# Patient Record
Sex: Male | Born: 1955 | Race: White | Hispanic: No | Smoking: Current every day smoker
Health system: Southern US, Community
[De-identification: ages and names within clinical notes are randomized; demographics above are authoritative.]

## PROBLEM LIST (undated history)

## (undated) DIAGNOSIS — Z87442 Personal history of urinary calculi: Secondary | ICD-10-CM

## (undated) DIAGNOSIS — I10 Essential (primary) hypertension: Secondary | ICD-10-CM

## (undated) DIAGNOSIS — E7800 Pure hypercholesterolemia, unspecified: Secondary | ICD-10-CM

## (undated) DIAGNOSIS — I240 Acute coronary thrombosis not resulting in myocardial infarction: Secondary | ICD-10-CM

## (undated) DIAGNOSIS — E119 Type 2 diabetes mellitus without complications: Secondary | ICD-10-CM

## (undated) HISTORY — PX: CYSTOSTOMY W/ STENT INSERTION: SHX1434

## (undated) HISTORY — PX: LITHOTRIPSY: SUR834

## (undated) HISTORY — PX: OTHER SURGICAL HISTORY: SHX169

---

## 2018-05-02 ENCOUNTER — Other Ambulatory Visit: Payer: Self-pay | Admitting: Urology

## 2018-05-05 NOTE — Progress Notes (Signed)
Spoke to patient on telephone about extracorporeal shock wave lithotripsy scheduled for August 1st. Patient to arrive to short stay at 6 AM. Reviewed instructions on Pre ESWL instruction sheet. Obtained EKG from Dr Drue DunJames Milam's office in [place], [place].

## 2018-05-08 ENCOUNTER — Ambulatory Visit (HOSPITAL_COMMUNITY)
Admission: RE | Admit: 2018-05-08 | Discharge: 2018-05-08 | Disposition: A | Discharge status: Home / Self Care | Payer: BLUE CROSS/BLUE SHIELD | Source: Ambulatory Visit | Attending: Urology | Admitting: Urology

## 2018-05-08 ENCOUNTER — Encounter (HOSPITAL_COMMUNITY): Payer: Self-pay | Admitting: General Practice

## 2018-05-08 ENCOUNTER — Encounter (HOSPITAL_COMMUNITY)
Admission: RE | Disposition: A | Discharge status: Home / Self Care | Payer: Self-pay | Source: Ambulatory Visit | Attending: Urology

## 2018-05-08 ENCOUNTER — Ambulatory Visit (HOSPITAL_COMMUNITY): Payer: BLUE CROSS/BLUE SHIELD

## 2018-05-08 DIAGNOSIS — Z7982 Long term (current) use of aspirin: Secondary | ICD-10-CM | POA: Diagnosis not present

## 2018-05-08 DIAGNOSIS — Z955 Presence of coronary angioplasty implant and graft: Secondary | ICD-10-CM | POA: Insufficient documentation

## 2018-05-08 DIAGNOSIS — E119 Type 2 diabetes mellitus without complications: Secondary | ICD-10-CM | POA: Diagnosis not present

## 2018-05-08 DIAGNOSIS — I10 Essential (primary) hypertension: Secondary | ICD-10-CM | POA: Diagnosis not present

## 2018-05-08 DIAGNOSIS — N200 Calculus of kidney: Secondary | ICD-10-CM | POA: Insufficient documentation

## 2018-05-08 DIAGNOSIS — Z7901 Long term (current) use of anticoagulants: Secondary | ICD-10-CM | POA: Insufficient documentation

## 2018-05-08 DIAGNOSIS — Z794 Long term (current) use of insulin: Secondary | ICD-10-CM | POA: Insufficient documentation

## 2018-05-08 DIAGNOSIS — Z79899 Other long term (current) drug therapy: Secondary | ICD-10-CM | POA: Diagnosis not present

## 2018-05-08 HISTORY — PX: EXTRACORPOREAL SHOCK WAVE LITHOTRIPSY: SHX1557

## 2018-05-08 HISTORY — DX: Acute coronary thrombosis not resulting in myocardial infarction: I24.0

## 2018-05-08 HISTORY — DX: Personal history of urinary calculi: Z87.442

## 2018-05-08 HISTORY — DX: Type 2 diabetes mellitus without complications: E11.9

## 2018-05-08 HISTORY — DX: Pure hypercholesterolemia, unspecified: E78.00

## 2018-05-08 HISTORY — DX: Essential (primary) hypertension: I10

## 2018-05-08 LAB — GLUCOSE, CAPILLARY: Glucose-Capillary: 252 mg/dL — ABNORMAL HIGH (ref 70–99)

## 2018-05-08 SURGERY — LITHOTRIPSY, ESWL
Anesthesia: LOCAL | Laterality: Left

## 2018-05-08 MED ORDER — HYDROCODONE-ACETAMINOPHEN 10-325 MG PO TABS: 1.0000 | ORAL_TABLET | ORAL | 0 refills | Status: AC | PRN

## 2018-05-08 MED ORDER — CIPROFLOXACIN HCL 500 MG PO TABS
500.0000 mg | ORAL_TABLET | ORAL | Status: AC
  Administered 2018-05-08: 500 mg via ORAL
  Filled 2018-05-08: qty 1

## 2018-05-08 MED ORDER — SODIUM CHLORIDE 0.9 % IV SOLN
INTRAVENOUS | Status: DC
  Administered 2018-05-08: 07:00:00 via INTRAVENOUS

## 2018-05-08 MED ORDER — DIAZEPAM 5 MG PO TABS
10.0000 mg | ORAL_TABLET | ORAL | Status: AC
  Administered 2018-05-08: 10 mg via ORAL
  Filled 2018-05-08: qty 2

## 2018-05-08 MED ORDER — DIPHENHYDRAMINE HCL 25 MG PO CAPS
25.0000 mg | ORAL_CAPSULE | ORAL | Status: AC
  Administered 2018-05-08: 25 mg via ORAL
  Filled 2018-05-08: qty 1

## 2018-05-08 MED ORDER — TAMSULOSIN HCL 0.4 MG PO CAPS: 0.4000 mg | ORAL_CAPSULE | ORAL | 0 refills | Status: AC

## 2018-05-08 NOTE — H&P (Signed)
HPI: Calvin Bolton is a 62 year-old male with a left renal calculus.  The problem is on the left side. He first stated noticing pain on approximately 04/11/2018. This is not his first kidney stone. He has had more than 5 stones prior to getting this one. He is currently having back pain. He denies having flank pain, groin pain, nausea, vomiting, fever, and chills. He has not caught a stone in his urine strainer since his symptoms began.   He has had eswl, ureteral stent, and ureteroscopy for treatment of his stones in the past.   Patient has had multiple stones in the past. He had a KUB in an outside facility and was told that he had a large 1 cm kidney stone according to him. He has been having some left flank pain. This morning the lower back and sometimes radiates towards the groin. He denies any hematuria or dysuria. He does state that he has a history of trace blood in his urine. Urinalysis is negative today except for 3+ glucose. Denies any nausea, vomiting, fever.     ALLERGIES: None   MEDICATIONS: Lipitor 40 mg tablet  Lisinopril 10 mg tablet  Metformin Hcl 1,000 mg tablet  Aspir 81  Humalog  Lantus     GU PSH: None   NON-GU PSH: None   GU PMH: Renal calculus    NON-GU PMH: Diabetes Type 2 Hypercholesterolemia Hypertension    FAMILY HISTORY: 1 Daughter - Other   SOCIAL HISTORY: Marital Status: Single Preferred Language: English; Race: White Current Smoking Status: Patient smokes.   Tobacco Use Assessment Completed: Used Tobacco in last 30 days? Drinks 2 caffeinated drinks per day.    REVIEW OF SYSTEMS:    GU Review Male:   Patient reports hard to postpone urination, erection problems, and frequent urination. Patient denies get up at night to urinate, leakage of urine, burning/ pain with urination, trouble starting your stream, penile pain, have to strain to urinate , and stream starts and stops.  Gastrointestinal (Upper):   Patient reports vomiting. Patient denies  nausea and indigestion/ heartburn.  Gastrointestinal (Lower):   Patient denies diarrhea and constipation.  Constitutional:   Patient denies fever, night sweats, weight loss, and fatigue.  Skin:   Patient denies skin rash/ lesion and itching.  Eyes:   Patient denies blurred vision and double vision.  Ears/ Nose/ Throat:   Patient denies sore throat and sinus problems.  Hematologic/Lymphatic:   Patient denies swollen glands and easy bruising.  Cardiovascular:   Patient denies leg swelling and chest pains.  Respiratory:   Patient denies cough and shortness of breath.  Endocrine:   Patient denies excessive thirst.  Musculoskeletal:   Patient reports back pain. Patient denies joint pain.  Neurological:   Patient reports dizziness. Patient denies headaches.  Psychologic:   Patient denies depression and anxiety.   VITAL SIGNS:    Weight 262 lb / 118.84 kg  Height 73 in / 185.42 cm  BP 164/85 mmHg  Heart Rate 78 /min  Temperature 98.3 F / 36.8 C  BMI 34.6 kg/m   MULTI-SYSTEM PHYSICAL EXAMINATION:    Constitutional: Well-nourished. No physical deformities. Normally developed. Good grooming.  Respiratory: No labored breathing, no use of accessory muscles.   Cardiovascular: Normal temperature, adequate perfusion of extremities  Skin: No paleness, no jaundice  Neurologic / Psychiatric: Oriented to time, oriented to place, oriented to person. No depression, no anxiety, no agitation.  Gastrointestinal: No mass, no tenderness, no rigidity, obese abdomen.  Eyes: Normal conjunctivae. Normal eyelids.  Musculoskeletal: Normal gait and station of head and neck.     PAST DATA REVIEWED:  Source Of History:  Patient  X-Ray Review: KUB: Reviewed Report. Discussed With Patient. Records that have been scanned and reveal a KUB that states that he has some kidney stones. I do not have the images for personal review.    PROCEDURES:         C.T. Urogram - 74176    A 13 mm left lower pole nonobstructing  renal calculus           Urinalysis Dipstick Dipstick Cont'd  Color: Yellow Bilirubin: Neg mg/dL  Appearance: Clear Ketones: Neg mg/dL  Specific Gravity: 6.9621.025 Blood: Neg ery/uL  pH: 5.5 Protein: Trace mg/dL  Glucose: 3+ mg/dL Urobilinogen: 0.2 mg/dL    Nitrites: Neg    Leukocyte Esterase: Neg leu/uL    ASSESSMENT:      ICD-10 Details  1 GU:   Renal calculus - N20.0   2   Flank Pain - R10.84    We discussed the management of urinary stones. These options include observation, ureteroscopy, shockwave lithotripsy, and PCNL. We discussed which options are relevant to these particular stones. We discussed the natural history of stones as well as the complications of untreated stones and the impact on quality of life without treatment as well as with each of the above listed treatments. We also discussed the efficacy of each treatment in its ability to clear the stone burden. With any of these management options I discussed the signs and symptoms of infection and the need for emergent treatment should these be experienced. For each option we discussed the ability of each procedure to clear the patient of their stone burden.   For observation I described the risks which include but are not limited to silent renal damage, life-threatening infection, need for emergent surgery, failure to pass stone, and pain.   For ureteroscopy I described the risks which include heart attack, stroke, pulmonary embolus, death, bleeding, infection, damage to contiguous structures, positioning injury, ureteral stricture, ureteral avulsion, ureteral injury, need for ureteral stent, inability to perform ureteroscopy, need for an interval procedure, inability to clear stone burden, stent discomfort and pain.   For shockwave lithotripsy I described the risks which include arrhythmia, kidney contusion, kidney hemorrhage, need for transfusion, pain, inability to break up stone, inability to pass stone fragments,  Steinstrasse, infection associated with obstructing stones, need for different surgical procedure, need for repeat shockwave lithotripsy, and death.   For PCNL I described the risks including heart attack, pulmonary embolus, death, positioning injury, pneumothorax, hydrothorax, need for chest tube, inability to clear stone burden, renal laceration, arterial venous fistula or malformation, need for embolization of kidney, loss of kidney or renal function, need for repeat procedure, need for prolonged nephrostomy tube, ureteral avulsion.    He has elected to proceed with lithotripsy of his left renal calculus.

## 2018-05-08 NOTE — Discharge Instructions (Signed)
Lithotripsy, Care After °This sheet gives you information about how to care for yourself after your procedure. Your health care provider may also give you more specific instructions. If you have problems or questions, contact your health care provider. °What can I expect after the procedure? °After the procedure, it is common to have: °· Some blood in your urine. This should only last for a few days. °· Soreness in your back, sides, or upper abdomen for a few days. °· Blotches or bruises on your back where the pressure wave entered the skin. °· Pain, discomfort, or nausea when pieces (fragments) of the kidney stone move through the tube that carries urine from the kidney to the bladder (ureter). Stone fragments may pass soon after the procedure, but they may continue to pass for up to 4-8 weeks. °? If you have severe pain or nausea, contact your health care provider. This may be caused by a large stone that was not broken up, and this may mean that you need more treatment. °· Some pain or discomfort during urination. °· Some pain or discomfort in the lower abdomen or (in men) at the base of the penis. ° °Follow these instructions at home: °Medicines °· Take over-the-counter and prescription medicines only as told by your health care provider. °· If you were prescribed an antibiotic medicine, take it as told by your health care provider. Do not stop taking the antibiotic even if you start to feel better. °· Do not drive for 24 hours if you were given a medicine to help you relax (sedative). °· Do not drive or use heavy machinery while taking prescription pain medicine. °Eating and drinking °· Drink enough water and fluids to keep your urine clear or pale yellow. This helps any remaining pieces of the stone to pass. It can also help prevent new stones from forming. °· Eat plenty of fresh fruits and vegetables. °· Follow instructions from your health care provider about eating and drinking restrictions. You may be  instructed: °? To reduce how much salt (sodium) you eat or drink. Check ingredients and nutrition facts on packaged foods and beverages. °? To reduce how much meat you eat. °· Eat the recommended amount of calcium for your age and gender. Ask your health care provider how much calcium you should have. °General instructions °· Get plenty of rest. °· Most people can resume normal activities 1-2 days after the procedure. Ask your health care provider what activities are safe for you. °· If directed, strain all urine through the strainer that was provided by your health care provider. °? Keep all fragments for your health care provider to see. Any stones that are found may be sent to a medical lab for examination. The stone may be as small as a grain of salt. °· Keep all follow-up visits as told by your health care provider. This is important. °Contact a health care provider if: °· You have pain that is severe or does not get better with medicine. °· You have nausea that is severe or does not go away. °· You have blood in your urine longer than your health care provider told you to expect. °· You have more blood in your urine. °· You have pain during urination that does not go away. °· You urinate more frequently than usual and this does not go away. °· You develop a rash or any other possible signs of an allergic reaction. °Get help right away if: °· You have severe pain in   your back, sides, or upper abdomen. °· You have severe pain while urinating. °· Your urine is very dark red. °· You have blood in your stool (feces). °· You cannot pass any urine at all. °· You feel a strong urge to urinate after emptying your bladder. °· You have a fever or chills. °· You develop shortness of breath, difficulty breathing, or chest pain. °· You have severe nausea that leads to persistent vomiting. °· You faint. °Summary °· After this procedure, it is common to have some pain, discomfort, or nausea when pieces (fragments) of the  kidney stone move through the tube that carries urine from the kidney to the bladder (ureter). If this pain or nausea is severe, however, you should contact your health care provider. °· Most people can resume normal activities 1-2 days after the procedure. Ask your health care provider what activities are safe for you. °· Drink enough water and fluids to keep your urine clear or pale yellow. This helps any remaining pieces of the stone to pass, and it can help prevent new stones from forming. °· If directed, strain your urine and keep all fragments for your health care provider to see. Fragments or stones may be as small as a grain of salt. °· Get help right away if you have severe pain in your back, sides, or upper abdomen or have severe pain while urinating. °This information is not intended to replace advice given to you by your health care provider. Make sure you discuss any questions you have with your health care provider. °Document Released: 10/14/2007 Document Revised: 08/15/2016 Document Reviewed: 08/15/2016 °[employer] Interactive Patient Education © 2018 [employer] ° °

## 2018-07-28 ENCOUNTER — Encounter (HOSPITAL_COMMUNITY): Payer: Self-pay | Admitting: Urology

## 2018-12-25 ENCOUNTER — Ambulatory Visit: Payer: Self-pay | Admitting: "Endocrinology

## 2019-02-16 ENCOUNTER — Ambulatory Visit: Payer: Self-pay | Admitting: "Endocrinology

## 2019-05-21 IMAGING — DX DG ABDOMEN 1V
3 series · 3 of 3 positions shown · non-contrast
Comparison: CT scan of April 25, 2018.

CLINICAL DATA: Preop for lithotripsy.

EXAM:
ABDOMEN - 1 VIEW

[abdomen kub (1 of 3)]
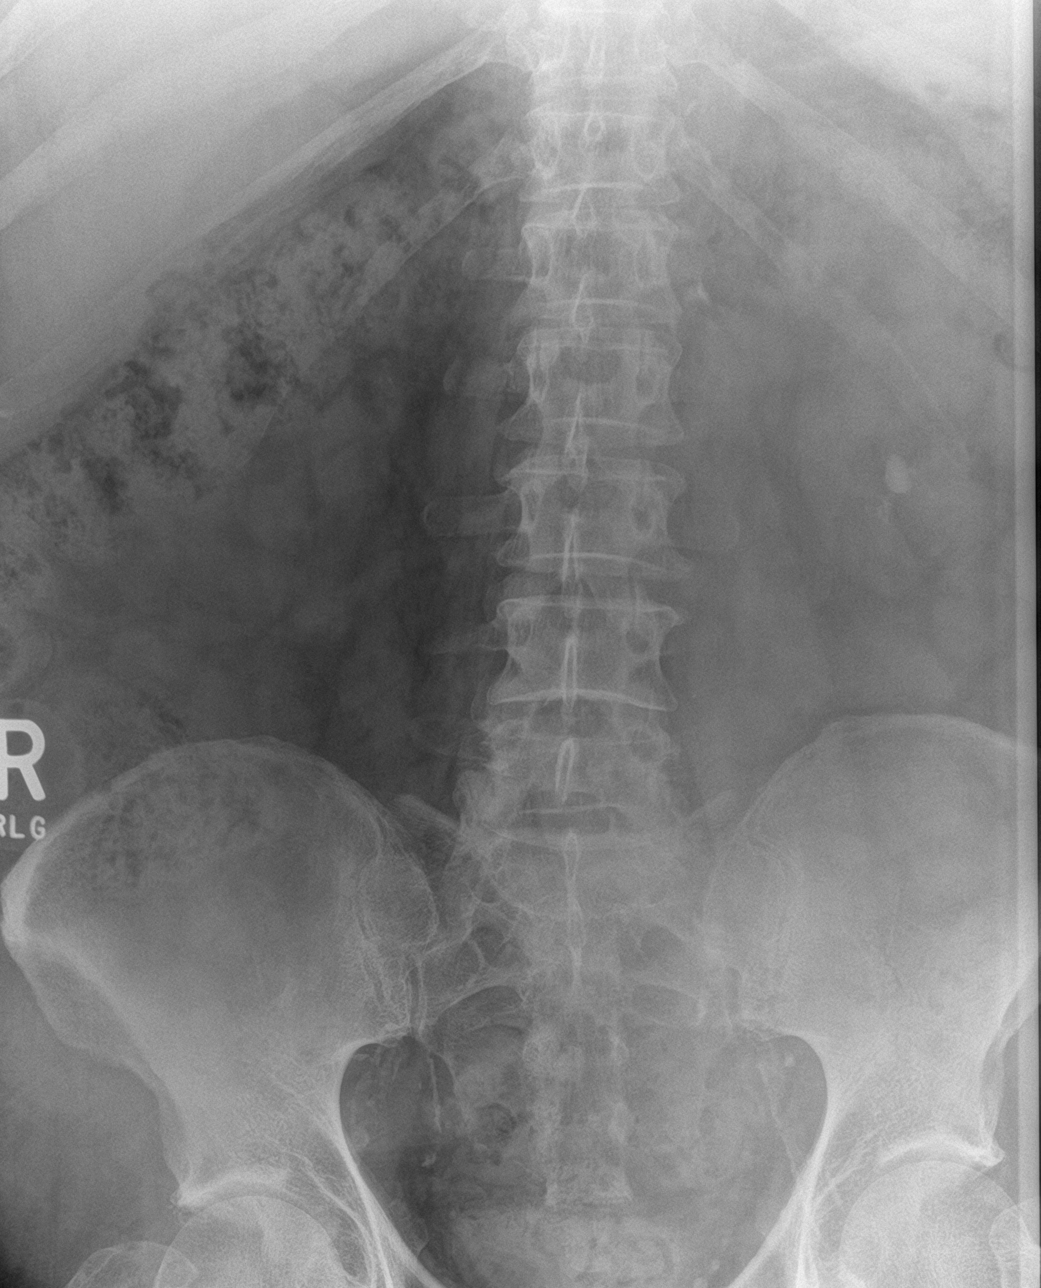

[abdomen kub (2 of 3)]
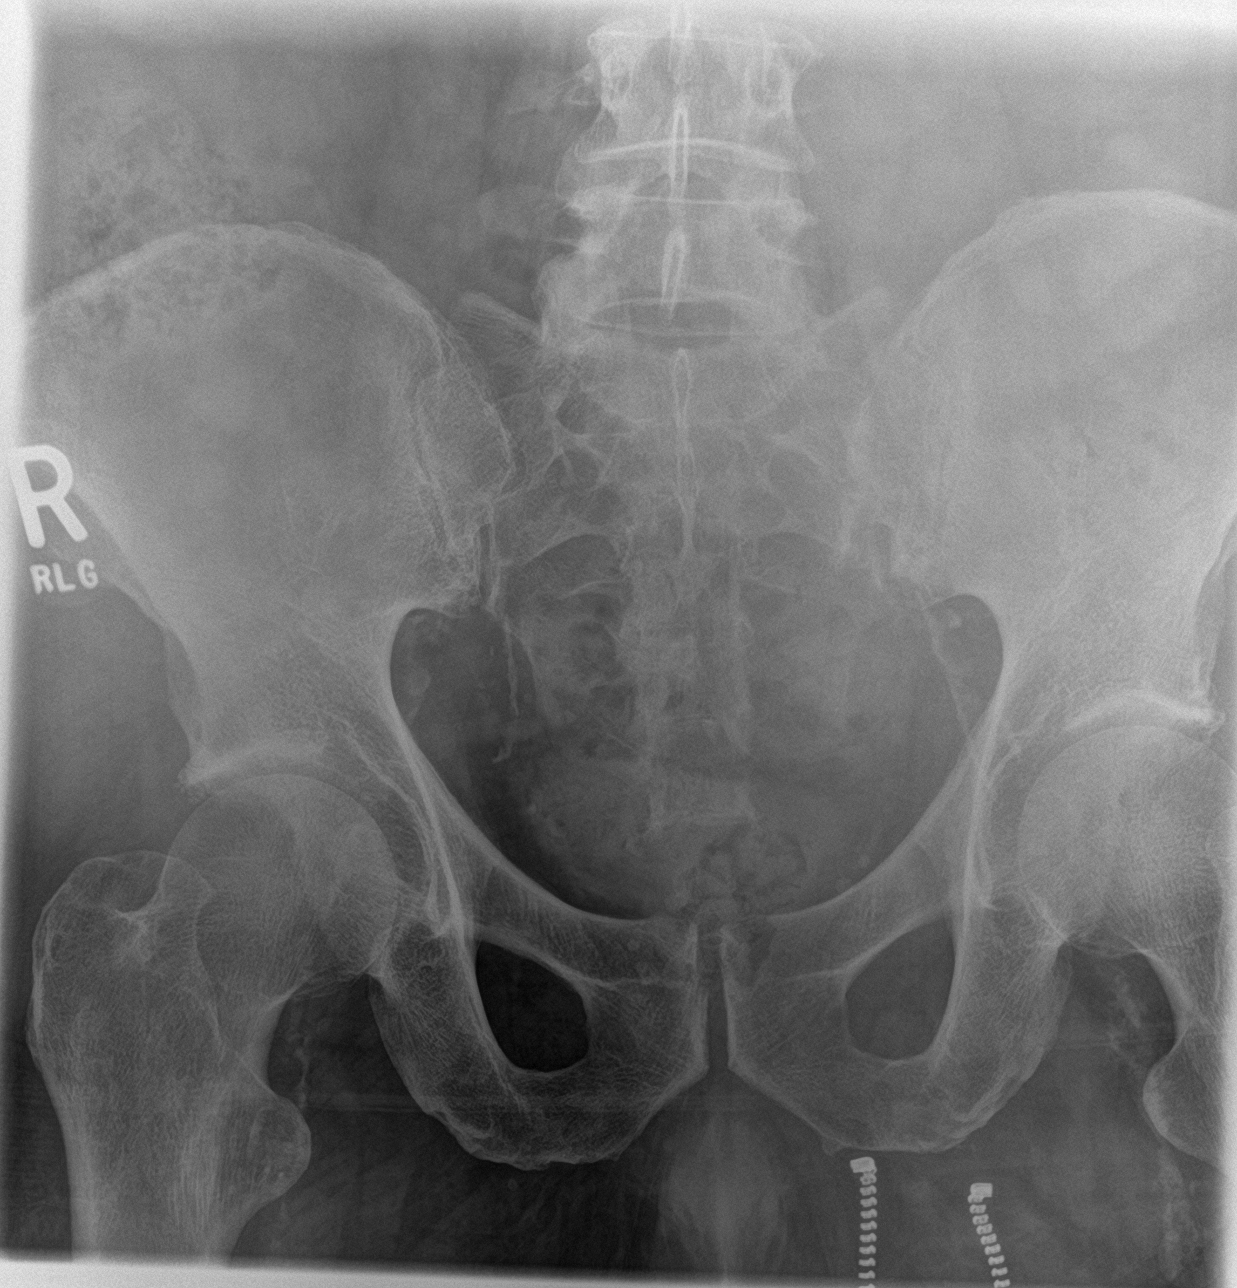

[abdomen kub (3 of 3)]
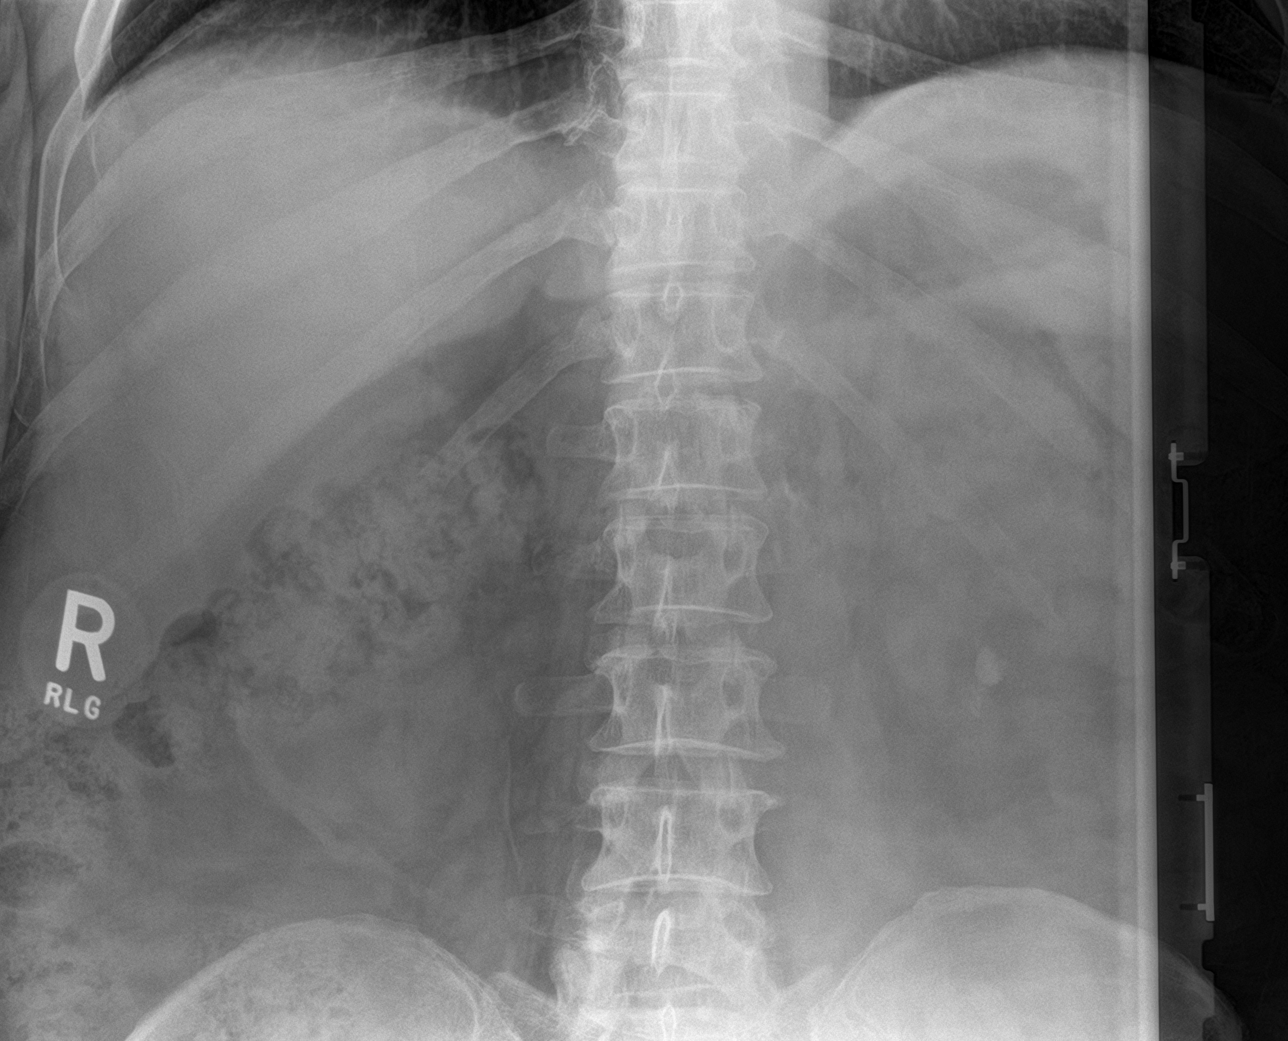

[3 of 3 positions shown; findings below may reference images not displayed]

FINDINGS: The bowel gas pattern is normal. Large calculus is seen in lower
pole collecting system of left kidney. Phleboliths are noted in the
pelvis.
IMPRESSION: Large left renal calculus is noted. No evidence of bowel obstruction
or ileus.

## 2020-03-08 DEATH — deceased
